# Patient Record
Sex: Male | Born: 1995 | Race: Black or African American | Hispanic: No | Marital: Single | Smoking: Never smoker
Health system: Southern US, Community
[De-identification: ages and names within clinical notes are randomized; demographics above are authoritative.]

## PROBLEM LIST (undated history)

## (undated) DIAGNOSIS — K589 Irritable bowel syndrome without diarrhea: Secondary | ICD-10-CM

---

## 2015-03-03 ENCOUNTER — Emergency Department (HOSPITAL_COMMUNITY): Payer: Medicaid Other

## 2015-03-03 ENCOUNTER — Emergency Department (HOSPITAL_COMMUNITY)
Admission: EM | Admit: 2015-03-03 | Discharge: 2015-03-03 | Disposition: A | Discharge status: Home / Self Care | Payer: Medicaid Other | Attending: Physician Assistant | Admitting: Physician Assistant

## 2015-03-03 ENCOUNTER — Encounter (HOSPITAL_COMMUNITY): Payer: Self-pay | Admitting: Emergency Medicine

## 2015-03-03 DIAGNOSIS — J069 Acute upper respiratory infection, unspecified: Secondary | ICD-10-CM | POA: Insufficient documentation

## 2015-03-03 DIAGNOSIS — R110 Nausea: Secondary | ICD-10-CM | POA: Insufficient documentation

## 2015-03-03 DIAGNOSIS — Z79899 Other long term (current) drug therapy: Secondary | ICD-10-CM | POA: Insufficient documentation

## 2015-03-03 DIAGNOSIS — R197 Diarrhea, unspecified: Secondary | ICD-10-CM | POA: Insufficient documentation

## 2015-03-03 DIAGNOSIS — R05 Cough: Secondary | ICD-10-CM | POA: Diagnosis present

## 2015-03-03 DIAGNOSIS — Z792 Long term (current) use of antibiotics: Secondary | ICD-10-CM | POA: Diagnosis not present

## 2015-03-03 DIAGNOSIS — J40 Bronchitis, not specified as acute or chronic: Secondary | ICD-10-CM | POA: Insufficient documentation

## 2015-03-03 MED ORDER — ONDANSETRON 4 MG PO TBDP: 4.0000 mg | ORAL_TABLET | Freq: Once | ORAL | Status: DC

## 2015-03-03 MED ORDER — PREDNISONE 20 MG PO TABS
60.0000 mg | ORAL_TABLET | Freq: Once | ORAL | Status: AC
  Administered 2015-03-03: 60 mg via ORAL
  Filled 2015-03-03: qty 3

## 2015-03-03 MED ORDER — BENZONATATE 100 MG PO CAPS
100.0000 mg | ORAL_CAPSULE | Freq: Once | ORAL | Status: AC
  Administered 2015-03-03: 100 mg via ORAL
  Filled 2015-03-03: qty 1

## 2015-03-03 MED ORDER — PREDNISONE 20 MG PO TABS: 40.0000 mg | ORAL_TABLET | Freq: Every day | ORAL | Status: AC

## 2015-03-03 MED ORDER — BENZONATATE 100 MG PO CAPS: 100.0000 mg | ORAL_CAPSULE | Freq: Three times a day (TID) | ORAL | Status: AC

## 2015-03-03 MED ORDER — IPRATROPIUM-ALBUTEROL 0.5-2.5 (3) MG/3ML IN SOLN
3.0000 mL | Freq: Once | RESPIRATORY_TRACT | Status: AC
  Administered 2015-03-03: 3 mL via RESPIRATORY_TRACT
  Filled 2015-03-03: qty 3

## 2015-03-03 MED ORDER — ALBUTEROL SULFATE HFA 108 (90 BASE) MCG/ACT IN AERS: 2.0000 | INHALATION_SPRAY | RESPIRATORY_TRACT | Status: AC | PRN

## 2015-03-03 NOTE — Discharge Instructions (Signed)
Acute Bronchitis  Bronchitis is inflammation of the airways that extend from the windpipe into the lungs (bronchi). The inflammation often causes mucus to develop. This leads to a cough, which is the most common symptom of bronchitis.  In acute bronchitis, the condition usually develops suddenly and goes away over time, usually in a couple weeks. Smoking, allergies, and asthma can make bronchitis worse. Repeated episodes of bronchitis may cause further lung problems.  CAUSES Acute bronchitis is most often caused by the same virus that causes a cold. The virus can spread from person to person (contagious) through coughing, sneezing, and touching contaminated objects. SIGNS AND SYMPTOMS   Cough.   Fever.   Coughing up mucus.   Body aches.   Chest congestion.   Chills.   Shortness of breath.   Sore throat.  DIAGNOSIS  Acute bronchitis is usually diagnosed through a physical exam. Your health care provider will also ask you questions about your medical history. Tests, such as chest X-rays, are sometimes done to rule out other conditions.  TREATMENT  Acute bronchitis usually goes away in a couple weeks. Oftentimes, no medical treatment is necessary. Medicines are sometimes given for relief of fever or cough. Antibiotic medicines are usually not needed but may be prescribed in certain situations. In some cases, an inhaler may be recommended to help reduce shortness of breath and control the cough. A cool mist vaporizer may also be used to help thin bronchial secretions and make it easier to clear the chest.  HOME CARE INSTRUCTIONS  Get plenty of rest.   Drink enough fluids to keep your urine clear or pale yellow (unless you have a medical condition that requires fluid restriction). Increasing fluids may help thin your respiratory secretions (sputum) and reduce chest congestion, and it will prevent dehydration.   Take medicines only as directed by your health care provider.  If  you were prescribed an antibiotic medicine, finish it all even if you start to feel better.  Avoid smoking and secondhand smoke. Exposure to cigarette smoke or irritating chemicals will make bronchitis worse. If you are a smoker, consider using nicotine gum or skin patches to help control withdrawal symptoms. Quitting smoking will help your lungs heal faster.   Reduce the chances of another bout of acute bronchitis by washing your hands frequently, avoiding people with cold symptoms, and trying not to touch your hands to your mouth, nose, or eyes.   Keep all follow-up visits as directed by your health care provider.  SEEK MEDICAL CARE IF: Your symptoms do not improve after 1 week of treatment.  SEEK IMMEDIATE MEDICAL CARE IF:  You develop an increased fever or chills.   You have chest pain.   You have severe shortness of breath.  You have bloody sputum.   You develop dehydration.  You faint or repeatedly feel like you are going to pass out.  You develop repeated vomiting.  You develop a severe headache. MAKE SURE YOU:   Understand these instructions.  Will watch your condition.  Will get help right away if you are not doing well or get worse.   This information is not intended to replace advice given to you by your health care provider. Make sure you discuss any questions you have with your health care provider.   Document Released: 02/08/2004 Document Revised: 01/21/2014 Document Reviewed: 06/23/2012 Elsevier Interactive Patient Education 2016 [employer] Inc[employer]   Upper Respiratory Infection, Adult Most upper respiratory infections (URIs) are a viral infection of the air  passages leading to the lungs. A URI affects the nose, throat, and upper air passages. The most common type of URI is nasopharyngitis and is typically referred to as "the common cold." URIs run their course and usually go away on their own. Most of the time, a URI does not require medical attention, but  sometimes a bacterial infection in the upper airways can follow a viral infection. This is called a secondary infection. Sinus and middle ear infections are common types of secondary upper respiratory infections. Bacterial pneumonia can also complicate a URI. A URI can worsen asthma and chronic obstructive pulmonary disease (COPD). Sometimes, these complications can require emergency medical care and may be life threatening.  CAUSES Almost all URIs are caused by viruses. A virus is a type of germ and can spread from one person to another.  RISKS FACTORS You may be at risk for a URI if:   You smoke.   You have chronic heart or lung disease.  You have a weakened defense (immune) system.   You are very young or very old.   You have nasal allergies or asthma.  You work in crowded or poorly ventilated areas.  You work in health care facilities or schools. SIGNS AND SYMPTOMS  Symptoms typically develop 2-3 days after you come in contact with a cold virus. Most viral URIs last 7-10 days. However, viral URIs from the influenza virus (flu virus) can last 14-18 days and are typically more severe. Symptoms may include:   Runny or stuffy (congested) nose.   Sneezing.   Cough.   Sore throat.   Headache.   Fatigue.   Fever.   Loss of appetite.   Pain in your forehead, behind your eyes, and over your cheekbones (sinus pain).  Muscle aches.  DIAGNOSIS  Your health care provider may diagnose a URI by:  Physical exam.  Tests to check that your symptoms are not due to another condition such as:  Strep throat.  Sinusitis.  Pneumonia.  Asthma. TREATMENT  A URI goes away on its own with time. It cannot be cured with medicines, but medicines may be prescribed or recommended to relieve symptoms. Medicines may help:  Reduce your fever.  Reduce your cough.  Relieve nasal congestion. HOME CARE INSTRUCTIONS   Take medicines only as directed by your health care  provider.   Gargle warm saltwater or take cough drops to comfort your throat as directed by your health care provider.  Use a warm mist humidifier or inhale steam from a shower to increase air moisture. This may make it easier to breathe.  Drink enough fluid to keep your urine clear or pale yellow.   Eat soups and other clear broths and maintain good nutrition.   Rest as needed.   Return to work when your temperature has returned to normal or as your health care provider advises. You may need to stay home longer to avoid infecting others. You can also use a face mask and careful hand washing to prevent spread of the virus.  Increase the usage of your inhaler if you have asthma.   Do not use any tobacco products, including cigarettes, chewing tobacco, or electronic cigarettes. If you need help quitting, ask your health care provider. PREVENTION  The best way to protect yourself from getting a cold is to practice good hygiene.   Avoid oral or hand contact with people with cold symptoms.   Wash your hands often if contact occurs.  There is no clear  evidence that vitamin C, vitamin E, echinacea, or exercise reduces the chance of developing a cold. However, it is always recommended to get plenty of rest, exercise, and practice good nutrition.  SEEK MEDICAL CARE IF:   You are getting worse rather than better.   Your symptoms are not controlled by medicine.   You have chills.  You have worsening shortness of breath.  You have brown or red mucus.  You have yellow or brown nasal discharge.  You have pain in your face, especially when you bend forward.  You have a fever.  You have swollen neck glands.  You have pain while swallowing.  You have white areas in the back of your throat. SEEK IMMEDIATE MEDICAL CARE IF:   You have severe or persistent:  Headache.  Ear pain.  Sinus pain.  Chest pain.  You have chronic lung disease and any of the  following:  Wheezing.  Prolonged cough.  Coughing up blood.  A change in your usual mucus.  You have a stiff neck.  You have changes in your:  Vision.  Hearing.  Thinking.  Mood. MAKE SURE YOU:   Understand these instructions.  Will watch your condition.  Will get help right away if you are not doing well or get worse.   This information is not intended to replace advice given to you by your health care provider. Make sure you discuss any questions you have with your health care provider.   Document Released: 06/26/2000 Document Revised: 05/17/2014 Document Reviewed: 04/07/2013 Elsevier Interactive Patient Education 2016 [employer] Inc.  Cough, Adult Coughing is a reflex that clears your throat and your airways. Coughing helps to heal and protect your lungs. It is normal to cough occasionally, but a cough that happens with other symptoms or lasts a long time may be a sign of a condition that needs treatment. A cough may last only 2-3 weeks (acute), or it may last longer than 8 weeks (chronic). CAUSES Coughing is commonly caused by:  Breathing in substances that irritate your lungs.  A viral or bacterial respiratory infection.  Allergies.  Asthma.  Postnasal drip.  Smoking.  Acid backing up from the stomach into the esophagus (gastroesophageal reflux).  Certain medicines.  Chronic lung problems, including COPD (or rarely, lung cancer).  Other medical conditions such as heart failure. HOME CARE INSTRUCTIONS  Pay attention to any changes in your symptoms. Take these actions to help with your discomfort:  Take medicines only as told by your health care provider.  If you were prescribed an antibiotic medicine, take it as told by your health care provider. Do not stop taking the antibiotic even if you start to feel better.  Talk with your health care provider before you take a cough suppressant medicine.  Drink enough fluid to keep your urine clear or pale  yellow.  If the air is dry, use a cold steam vaporizer or humidifier in your bedroom or your home to help loosen secretions.  Avoid anything that causes you to cough at work or at home.  If your cough is worse at night, try sleeping in a semi-upright position.  Avoid cigarette smoke. If you smoke, quit smoking. If you need help quitting, ask your health care provider.  Avoid caffeine.  Avoid alcohol.  Rest as needed. SEEK MEDICAL CARE IF:   You have new symptoms.  You cough up pus.  Your cough does not get better after 2-3 weeks, or your cough gets worse.  You cannot  control your cough with suppressant medicines and you are losing sleep.  You develop pain that is getting worse or pain that is not controlled with pain medicines.  You have a fever.  You have unexplained weight loss.  You have night sweats. SEEK IMMEDIATE MEDICAL CARE IF:  You cough up blood.  You have difficulty breathing.  Your heartbeat is very fast.   This information is not intended to replace advice given to you by your health care provider. Make sure you discuss any questions you have with your health care provider.   Document Released: 06/29/2010 Document Revised: 09/21/2014 Document Reviewed: 03/09/2014 Elsevier Interactive Patient Education 2016 [employer].  How to Use an Inhaler Using your inhaler correctly is very important. Good technique will make sure that the medicine reaches your lungs.  HOW TO USE AN INHALER:  Take the cap off the inhaler.  If this is the first time using your inhaler, you need to prime it. Shake the inhaler for 5 seconds. Release four puffs into the air, away from your face. Ask your doctor for help if you have questions.  Shake the inhaler for 5 seconds.  Turn the inhaler so the bottle is above the mouthpiece.  Put your pointer finger on top of the bottle. Your thumb holds the bottom of the inhaler.  Open your mouth.  Either hold the inhaler away from  your mouth (the width of 2 fingers) or place your lips tightly around the mouthpiece. Ask your doctor which way to use your inhaler.  Breathe out as much air as possible.  Breathe in and push down on the bottle 1 time to release the medicine. You will feel the medicine go in your mouth and throat.  Continue to take a deep breath in very slowly. Try to fill your lungs.  After you have breathed in completely, hold your breath for 10 seconds. This will help the medicine to settle in your lungs. If you cannot hold your breath for 10 seconds, hold it for as long as you can before you breathe out.  Breathe out slowly, through pursed lips. Whistling is an example of pursed lips.  If your doctor has told you to take more than 1 puff, wait at least 15-30 seconds between puffs. This will help you get the best results from your medicine. Do not use the inhaler more than your doctor tells you to.  Put the cap back on the inhaler.  Follow the directions from your doctor or from the inhaler package about cleaning the inhaler. If you use more than one inhaler, ask your doctor which inhalers to use and what order to use them in. Ask your doctor to help you figure out when you will need to refill your inhaler.  If you use a steroid inhaler, always rinse your mouth with water after your last puff, gargle and spit out the water. Do not swallow the water. GET HELP IF:  The inhaler medicine only partially helps to stop wheezing or shortness of breath.  You are having trouble using your inhaler.  You have some increase in thick spit (phlegm). GET HELP RIGHT AWAY IF:  The inhaler medicine does not help your wheezing or shortness of breath or you have tightness in your chest.  You have dizziness, headaches, or fast heart rate.  You have chills, fever, or night sweats.  You have a large increase of thick spit, or your thick spit is bloody. MAKE SURE YOU:   Understand these  instructions.  Will watch  your condition.  Will get help right away if you are not doing well or get worse.   This information is not intended to replace advice given to you by your health care provider. Make sure you discuss any questions you have with your health care provider.   Document Released: 10/10/2007 Document Revised: 10/21/2012 Document Reviewed: 07/30/2012 Elsevier Interactive Patient Education [employer]

## 2015-03-03 NOTE — ED Notes (Signed)
Pt states that he has had generalized body aches, cough, nausea and fatigue. Alert and oriented.

## 2015-03-03 NOTE — ED Notes (Signed)
Patient was alert, oriented and stable upon discharge. RN went over AVS and patient had no further questions.  

## 2015-03-03 NOTE — ED Provider Notes (Signed)
CSN: [id number]     Arrival date & time 03/03/15  2117 History  By signing my name below, I, Linus Galas, attest that this documentation has been prepared under the direction and in the presence of  Danelle Berry, PA-C. Electronically Signed: Linus Galas, ED Scribe. 03/04/2015. 10:59 PM.   Chief Complaint  Patient presents with  . Cough  . Generalized Body Aches   The history is provided by the patient. No language interpreter was used.   HPI Comments: Marvin Mendoza is a 20 y.o. male with no PMHx who presents to the Emergency Department complaining of cough and generalized body aches for one week with associated URI sx including  nasal congestion, productive cough with yellow sputum, fatigue, body ache.  He was seen several days ago by a provider at student [facility] and was given Allegra-D and amoxicillin. He does not know what the antibiotic was prescribed for. He reports no improvement in his symptoms. He did develop nausea and diarrhea over the past 2 days. He reports 1 brief instances of shortness of breath when walking on campus to class.  He denies wheeze.  He also had one episode of brief central chest pain 1 week ago that lasted for several hours but was not associated with shortness of breath, palpitations, diaphoresis, pallor, syncope, nausea or vomiting.   the chest pain had no radiation, was not worse with exertion, deep inspiration, cough or palpation. It spontaneously resolved and he has had no further chest pain issues he currently denies chest pain, wheezing and shortness of breath. Pt further denies vomiting, abdominal pain, fevers, chills, sweats, hemoptysis.  No recent travel.  Multiple sick contacts.  History reviewed. No pertinent past medical history. No past surgical history on file. History reviewed. No pertinent family history.   Social History  Substance Use Topics  . Smoking status: None  . Smokeless tobacco: None  . Alcohol Use: None    Review of Systems   Constitutional: Positive for fatigue. Negative for fever, chills and diaphoresis.  HENT: Positive for congestion.   Respiratory: Positive for cough and shortness of breath. Negative for wheezing.   Cardiovascular: Positive for chest pain.  Gastrointestinal: Positive for nausea and diarrhea. Negative for vomiting and abdominal pain.  Musculoskeletal: Positive for myalgias.  All other systems reviewed and are negative.   Allergies  Review of patient's allergies indicates no known allergies.  Home Medications   Prior to Admission medications   Medication Sig Start Date End Date Taking? Authorizing Provider  amoxicillin (AMOXIL) 500 MG capsule Take 500 mg by mouth 3 (three) times daily.   Yes Historical Provider, MD  diphenhydrAMINE (BENADRYL) 25 MG tablet Take 25 mg by mouth every 6 (six) hours as needed for allergies.   Yes Historical Provider, MD  fexofenadine-pseudoephedrine (ALLEGRA-D) 60-120 MG 12 hr tablet Take 1 tablet by mouth 2 (two) times daily as needed (allergies).   Yes Historical Provider, MD  hydrOXYzine (ATARAX/VISTARIL) 25 MG tablet Take 25 mg by mouth every 6 (six) hours as needed for anxiety (insomnia).  01/27/15  Yes Historical Provider, MD  NEXIUM 40 MG capsule Take 40 mg by mouth daily. Reported on 03/03/2015 02/20/15  Yes Historical Provider, MD  Phenyleph-Doxylamine-DM-APAP (ALKA SELTZER PLUS PO) Take 1 tablet by mouth every 6 (six) hours as needed (cold symptoms).   Yes Historical Provider, MD  promethazine (PHENERGAN) 25 MG tablet Take 25 mg by mouth every 6 (six) hours as needed for nausea or vomiting.  02/20/15  Yes Historical Provider,  MD  Pseudoeph-CPM-DM-APAP (TYLENOL COLD) 30-2-15-325 MG TABS Take 2 tablets by mouth every 6 (six) hours as needed (cold symptoms).   Yes Historical Provider, MD  Pseudoephedrine-APAP-DM (DAYQUIL PO) Take 2 capsules by mouth daily as needed (cold symptoms).   Yes Historical Provider, MD  triamcinolone cream (KENALOG) 0.1 % Apply 1  application topically 2 (two) times daily as needed (discoloration).  01/17/15  Yes Historical Provider, MD  albuterol (PROVENTIL HFA;VENTOLIN HFA) 108 (90 Base) MCG/ACT inhaler Inhale 2 puffs into the lungs every 4 (four) hours as needed for wheezing or shortness of breath. 03/03/15   Danelle Berry, PA-C  benzonatate (TESSALON) 100 MG capsule Take 1 capsule (100 mg total) by mouth every 8 (eight) hours. 03/03/15   Danelle Berry, PA-C  predniSONE (DELTASONE) 20 MG tablet Take 2 tablets (40 mg total) by mouth daily. 03/03/15   Danelle Berry, PA-C   BP 126/74 mmHg  Pulse 87  Temp(Src) 98.5 F (36.9 C) (Oral)  Resp 18  SpO2 99%   Physical Exam  Constitutional: He is oriented to person, place, and time. He appears well-developed and well-nourished. No distress.  Well appearing male, NAD  HENT:  Head: Normocephalic and atraumatic.  Right Ear: External ear normal.  Left Ear: External ear normal.  Nose: Nose normal.  Mouth/Throat: Oropharynx is clear and moist. No oropharyngeal exudate.  No sinus tenderness to palpation Nasal mucosa mildly erythematous with clear discharge  Eyes: Conjunctivae and EOM are normal. Pupils are equal, round, and reactive to light. Right eye exhibits no discharge. Left eye exhibits no discharge. No scleral icterus.  Neck: Normal range of motion. Neck supple. No JVD present. No tracheal deviation present.  Cardiovascular: Normal rate, regular rhythm, normal heart sounds and intact distal pulses.  Exam reveals no gallop and no friction rub.   No murmur heard. Pulmonary/Chest: Effort normal and breath sounds normal. No stridor. No respiratory distress. He has no wheezes. He has no rales. He exhibits no tenderness.  Abdominal: Soft. Bowel sounds are normal. He exhibits no distension. There is no tenderness. There is no rebound.  Musculoskeletal: Normal range of motion. He exhibits no edema.  Lymphadenopathy:    He has no cervical adenopathy.  Neurological: He is alert and  oriented to person, place, and time. He exhibits normal muscle tone. Coordination normal.  Skin: Skin is warm and dry. No rash noted. He is not diaphoretic. No erythema. No pallor.  Psychiatric: He has a normal mood and affect. His behavior is normal. Judgment and thought content normal.  Nursing note and vitals reviewed.   ED Course  Procedures   DIAGNOSTIC STUDIES: Oxygen Saturation is 99% on room air, normal by my interpretation.    COORDINATION OF CARE: 12:09 AM Discussed treatment plan with pt at bedside and pt agreed to plan.  Imaging Review Dg Chest 2 View  03/03/2015  CLINICAL DATA:  Cough and congestion. Central chest pain. Nausea and diarrhea. Symptoms for 1 week. Nonsmoker. EXAM: CHEST  2 VIEW COMPARISON:  None. FINDINGS: The heart size and mediastinal contours are within normal limits. Both lungs are clear. The visualized skeletal structures are unremarkable. IMPRESSION: No active cardiopulmonary disease. Electronically Signed   By: Burman Nieves M.D.   On: 03/03/2015 22:42   I have personally reviewed and evaluated these images and lab results as part of my medical decision-making.   MDM   Pt with URI sx, he is well appearing and non-toxic.  CXR negative.  Presentation consistent with URI and bronchitis, likely  viral.  Nausea and diarrhea may be viral, but patient o be a SE of Abx.   Brief CP not consistent with ACS.  He has no CP for the past 6 days, no indication for ACS w/up.  Pt is PERC negative, r/o PE. Pt was encouraged to continue sudafed/allergy meds, he was discharged with steroid taper, albuterol as needed for SOB, and tessalon pearls for cough.  Return precautions reviewed.   D/C in good condition with stable VS.   Final diagnoses:  URI (upper respiratory infection)  Bronchitis  I personally performed the services described in this documentation, which was scribed in my presence. The recorded information has been reviewed and is accurate.      Danelle Berry, PA-C 03/04/15 0010  Courteney Randall An, MD 03/05/15 1610

## 2015-11-04 ENCOUNTER — Encounter (HOSPITAL_COMMUNITY): Payer: Self-pay | Admitting: Emergency Medicine

## 2015-11-04 ENCOUNTER — Emergency Department (HOSPITAL_COMMUNITY)
Admission: EM | Admit: 2015-11-04 | Discharge: 2015-11-04 | Disposition: A | Discharge status: Home / Self Care | Payer: Medicaid Other | Attending: Emergency Medicine | Admitting: Emergency Medicine

## 2015-11-04 DIAGNOSIS — R51 Headache: Secondary | ICD-10-CM | POA: Diagnosis present

## 2015-11-04 DIAGNOSIS — R519 Headache, unspecified: Secondary | ICD-10-CM

## 2015-11-04 MED ORDER — FLUTICASONE PROPIONATE 50 MCG/ACT NA SUSP: 1.0000 | Freq: Every day | NASAL | 0 refills | Status: AC

## 2015-11-04 MED ORDER — DIPHENHYDRAMINE HCL 25 MG PO CAPS
25.0000 mg | ORAL_CAPSULE | Freq: Once | ORAL | Status: AC
  Administered 2015-11-04: 25 mg via ORAL
  Filled 2015-11-04: qty 1

## 2015-11-04 MED ORDER — METOCLOPRAMIDE HCL 10 MG PO TABS
10.0000 mg | ORAL_TABLET | Freq: Once | ORAL | Status: AC
  Administered 2015-11-04: 10 mg via ORAL
  Filled 2015-11-04: qty 1

## 2015-11-04 MED ORDER — KETOROLAC TROMETHAMINE 30 MG/ML IJ SOLN
30.0000 mg | Freq: Once | INTRAMUSCULAR | Status: AC
  Administered 2015-11-04: 30 mg via INTRAMUSCULAR
  Filled 2015-11-04: qty 1

## 2015-11-04 NOTE — ED Triage Notes (Signed)
Pt states he has been having a headache now for a week. States Excedrin helps relieve the pain. Pt states the pain is right behind forehead. Pt states he also has sinus pressure. Denies any sensitivity to light. States that he has had nausea but not right now.

## 2015-11-04 NOTE — Discharge Instructions (Signed)
Please read and follow all provided instructions.  Your diagnoses today include:  1. Nonintractable headache, unspecified chronicity pattern, unspecified headache type     Tests performed today include: CT of your head which was normal and did not show any serious cause of your headache Vital signs. See below for your results today.   Medications:  In the Emergency Department you received: Reglan - antinausea/headache medication Benadryl - antihistamine to counteract potential side effects of reglan Toradol - NSAID medication similar to ibuprofen  Take any prescribed medications only as directed.  Additional information:  Follow any educational materials contained in this packet.  You are having a headache. No specific cause was found today for your headache. It may have been a migraine or other cause of headache. Stress, anxiety, fatigue, and depression are common triggers for headaches.   Your headache today does not appear to be life-threatening or require hospitalization, but often the exact cause of headaches is not determined in the emergency department. Therefore, follow-up with your doctor is very important to find out what may have caused your headache and whether or not you need any further diagnostic testing or treatment.   Sometimes headaches can appear benign (not harmful), but then more serious symptoms can develop which should prompt an immediate re-evaluation by your doctor or the emergency department.  BE VERY CAREFUL not to take multiple medicines containing Tylenol (also called acetaminophen). Doing so can lead to an overdose which can damage your liver and cause liver failure and possibly death.   Follow-up instructions: Please follow-up with your primary care provider in the next 3 days for further evaluation of your symptoms.   Return instructions:  Please return to the Emergency Department if you experience worsening symptoms. Return if the medications do not  resolve your headache, if it recurs, or if you have multiple episodes of vomiting or cannot keep down fluids. Return if you have a change from the usual headache. RETURN IMMEDIATELY IF you: Develop a sudden, severe headache Develop confusion or become poorly responsive or faint Develop a fever above 100.62F or problem breathing Have a change in speech, vision, swallowing, or understanding Develop new weakness, numbness, tingling, incoordination in your arms or legs Have a seizure Please return if you have any other emergent concerns.  Additional Information:  Your vital signs today were: BP 122/72 (BP Location: Right Arm)    Pulse 81    Temp 98.3 F (36.8 C) (Oral)    Resp 20    Wt 71.7 kg    SpO2 100%  If your blood pressure (BP) was elevated above 135/85 this visit, please have this repeated by your doctor within one month. --------------

## 2015-11-04 NOTE — ED Provider Notes (Signed)
[facility]EMERGENCY DEPT Provider Note   CSN: [id number] Arrival date & time: 11/04/15  1519  History   Chief Complaint Chief Complaint  Patient presents with  . Headache    HPI Marvin Mendoza is a 20 y.o. male.  HPI  20 y.o. male presents to the Emergency Department today complaining of frontal headache x 1 week. Notes no hx of migraine headaches. States headache feels like a throbbing sensation that comes and goes. Rates pain 7/10. Associated nausea. No emesis. No CP/SOB/ABD pain. No visual changes. No numbness/tingling. No photophobia. Notes Excedrin with relief, but headache reoccurs. No fevers. Pt states that he usually gets these headache with season changes. No other symptoms noted.   History reviewed. No pertinent past medical history.  There are no active problems to display for this patient.   History reviewed. No pertinent surgical history.   Home Medications    Prior to Admission medications   Medication Sig Start Date End Date Taking? Authorizing Provider  albuterol (PROVENTIL HFA;VENTOLIN HFA) 108 (90 Base) MCG/ACT inhaler Inhale 2 puffs into the lungs every 4 (four) hours as needed for wheezing or shortness of breath. 03/03/15   Danelle BerryLeisa Tapia, PA-C  amoxicillin (AMOXIL) 500 MG capsule Take 500 mg by mouth 3 (three) times daily.    Historical Provider, MD  benzonatate (TESSALON) 100 MG capsule Take 1 capsule (100 mg total) by mouth every 8 (eight) hours. 03/03/15   Danelle BerryLeisa Tapia, PA-C  diphenhydrAMINE (BENADRYL) 25 MG tablet Take 25 mg by mouth every 6 (six) hours as needed for allergies.    Historical Provider, MD  fexofenadine-pseudoephedrine (ALLEGRA-D) 60-120 MG 12 hr tablet Take 1 tablet by mouth 2 (two) times daily as needed (allergies).    Historical Provider, MD  hydrOXYzine (ATARAX/VISTARIL) 25 MG tablet Take 25 mg by mouth every 6 (six) hours as needed for anxiety (insomnia).  01/27/15   Historical Provider, MD  NEXIUM 40 MG capsule Take 40 mg by mouth daily. Reported  on 03/03/2015 02/20/15   Historical Provider, MD  Phenyleph-Doxylamine-DM-APAP (ALKA SELTZER PLUS PO) Take 1 tablet by mouth every 6 (six) hours as needed (cold symptoms).    Historical Provider, MD  predniSONE (DELTASONE) 20 MG tablet Take 2 tablets (40 mg total) by mouth daily. 03/03/15   Danelle BerryLeisa Tapia, PA-C  promethazine (PHENERGAN) 25 MG tablet Take 25 mg by mouth every 6 (six) hours as needed for nausea or vomiting.  02/20/15   Historical Provider, MD  Pseudoeph-CPM-DM-APAP (TYLENOL COLD) 30-2-15-325 MG TABS Take 2 tablets by mouth every 6 (six) hours as needed (cold symptoms).    Historical Provider, MD  Pseudoephedrine-APAP-DM (DAYQUIL PO) Take 2 capsules by mouth daily as needed (cold symptoms).    Historical Provider, MD  triamcinolone cream (KENALOG) 0.1 % Apply 1 application topically 2 (two) times daily as needed (discoloration).  01/17/15   Historical Provider, MD   Family History No family history on file.  Social History Social History  Substance Use Topics  . Smoking status: Never Smoker  . Smokeless tobacco: Never Used  . Alcohol use Yes     Comment: socially   Allergies   Review of patient's allergies indicates no known allergies.   Review of Systems Review of Systems ROS reviewed and all are negative for acute change except as noted in the HPI.  Physical Exam Updated Vital Signs BP 122/72 (BP Location: Right Arm)   Pulse 81   Temp 98.3 F (36.8 C) (Oral)   Resp 20   Wt 71.7 kg  SpO2 100%   Physical Exam  Constitutional: He is oriented to person, place, and time. Vital signs are normal. He appears well-developed and well-nourished.  HENT:  Head: Normocephalic and atraumatic.  Right Ear: Hearing, tympanic membrane, external ear and ear canal normal.  Left Ear: Hearing, tympanic membrane, external ear and ear canal normal.  Nose: Nose normal.  Mouth/Throat: Uvula is midline, oropharynx is clear and moist and mucous membranes are normal.  Eyes: Conjunctivae and EOM  are normal. Pupils are equal, round, and reactive to light.  Neck: Normal range of motion. Neck supple.  Cardiovascular: Normal rate, regular rhythm, normal heart sounds and intact distal pulses.   Pulmonary/Chest: Effort normal and breath sounds normal.  Neurological: He is alert and oriented to person, place, and time. He has normal strength. No cranial nerve deficit or sensory deficit.  Cranial Nerves:  II: Pupils equal, round, reactive to light III,IV, VI: ptosis not present, extra-ocular motions intact bilaterally  V,VII: smile symmetric, facial light touch sensation equal VIII: hearing grossly normal bilaterally  IX,X: midline uvula rise  XI: bilateral shoulder shrug equal and strong XII: midline tongue extension  Skin: Skin is warm and dry.  Psychiatric: He has a normal mood and affect. His speech is normal and behavior is normal. Thought content normal.  Nursing note and vitals reviewed.  ED Treatments / Results  Labs (all labs ordered are listed, but only abnormal results are displayed) Labs Reviewed - No data to display  EKG  EKG Interpretation None      Radiology No results found.  Procedures Procedures (including critical care time)  Medications Ordered in ED Medications - No data to display   Initial Impression / Assessment and Plan / ED Course  I have reviewed the triage vital signs and the nursing notes.  Pertinent labs & imaging results that were available during my care of the patient were reviewed by me and considered in my medical decision making (see chart for details).  Clinical Course   Final Clinical Impressions(s) / ED Diagnoses  I have reviewed the relevant previous healthcare records. I obtained HPI from historian.  ED Course:  Assessment: Patient is a 20yM that presents with headache x 1 week. Patient is without high-risk features of headache including: Sudden onset/thunderclap HA, No similar headache in past, Altered mental status,  Accompanying seizure, Headache with exertion, Age > 50, History of immunocompromise, Neck or shoulder pain, Fever, Use of anticoagulation, Family history of spontaneous SAH, Concomitant drug use, Toxic exposure.  Patient has a normal complete neurological exam, normal vital signs, normal level of consciousness, no signs of meningismus, is well-appearing/non-toxic appearing, no signs of trauma. No papilledema, no pain over the temporal arteries. Imaging with CT/MRI not indicated given history and physical exam findings. No dangerous or life-threatening conditions suspected or identified by history, physical exam, and by work-up. No indications for hospitalization identified. Likely sinus pressure related. Given Toradol in ED and Rx Flonase. At time of discharge, Patient is in no acute distress. Vital Signs are stable. Patient is able to ambulate. Patient able to tolerate PO.   Disposition/Plan:  Dc Home Additional Verbal discharge instructions given and discussed with patient.  Pt Instructed to f/u with PCP in the next week for evaluation and treatment of symptoms. Return precautions given Pt acknowledges and agrees with plan  Supervising Physician Loren Racer, MD   Final diagnoses:  Nonintractable headache, unspecified chronicity pattern, unspecified headache type    New Prescriptions New Prescriptions   No medications  on file     Audry Pili, PA-C 11/04/15 1644    Loren Racer, MD 11/10/15 [phone]

## 2015-11-04 NOTE — ED Notes (Signed)
Patient verbalized understanding of discharge instructions and denies any further needs or questions at this time. VS stable. Patient ambulatory with steady gait.  

## 2015-11-12 ENCOUNTER — Emergency Department (HOSPITAL_COMMUNITY): Payer: Medicaid Other

## 2015-11-12 ENCOUNTER — Encounter (HOSPITAL_COMMUNITY): Payer: Self-pay | Admitting: *Deleted

## 2015-11-12 ENCOUNTER — Emergency Department (HOSPITAL_COMMUNITY)
Admission: EM | Admit: 2015-11-12 | Discharge: 2015-11-12 | Disposition: A | Discharge status: Home / Self Care | Payer: Medicaid Other | Attending: Emergency Medicine | Admitting: Emergency Medicine

## 2015-11-12 DIAGNOSIS — R519 Headache, unspecified: Secondary | ICD-10-CM

## 2015-11-12 DIAGNOSIS — R51 Headache: Secondary | ICD-10-CM | POA: Insufficient documentation

## 2015-11-12 HISTORY — DX: Irritable bowel syndrome, unspecified: K58.9

## 2015-11-12 LAB — I-STAT CHEM 8, ED
BUN: 12 mg/dL (ref 6–20)
CALCIUM ION: 1.2 mmol/L (ref 1.15–1.40)
Chloride: 102 mmol/L (ref 101–111)
Creatinine, Ser: 0.9 mg/dL (ref 0.61–1.24)
GLUCOSE: 102 mg/dL — AB (ref 65–99)
HCT: 42 % (ref 39.0–52.0)
HEMOGLOBIN: 14.3 g/dL (ref 13.0–17.0)
Potassium: 3.8 mmol/L (ref 3.5–5.1)
SODIUM: 142 mmol/L (ref 135–145)
TCO2: 26 mmol/L (ref 0–100)

## 2015-11-12 NOTE — Discharge Instructions (Signed)
Tylenol 1000 mg rotated with ibuprofen 600 mg every 4 hours as needed for headache.  Follow-up with your primary Dr. if not improving in the next 3-4 days.

## 2015-11-12 NOTE — ED Provider Notes (Signed)
[facility] Provider Note   CSN: [id number] Arrival date & time: 11/12/15  1209     History   Chief Complaint Chief Complaint  Patient presents with  . Headache  . Night Sweats    HPI Marvin Mendoza is a 20 y.o. male.  Patient is a 20 year old male with history of irritable bowel. He presents today for evaluation of headache for the past 2-1/2 weeks. He reports intermittent fever and feels sweaty at night. He denies any visual disturbances, nausea. He denies any dizziness or lightheadedness. He does have some relief with Excedrin, however the headache returns. He is currently taking Zithromax for a presumed sinus infection. This was prescribed one week ago.      Past Medical History:  Diagnosis Date  . IBS (irritable bowel syndrome)     There are no active problems to display for this patient.   History reviewed. No pertinent surgical history.     Home Medications    Prior to Admission medications   Medication Sig Start Date End Date Taking? Authorizing Provider  albuterol (PROVENTIL HFA;VENTOLIN HFA) 108 (90 Base) MCG/ACT inhaler Inhale 2 puffs into the lungs every 4 (four) hours as needed for wheezing or shortness of breath. 03/03/15   Danelle BerryLeisa Tapia, PA-C  amoxicillin (AMOXIL) 500 MG capsule Take 500 mg by mouth 3 (three) times daily.    Historical Provider, MD  benzonatate (TESSALON) 100 MG capsule Take 1 capsule (100 mg total) by mouth every 8 (eight) hours. 03/03/15   Danelle BerryLeisa Tapia, PA-C  diphenhydrAMINE (BENADRYL) 25 MG tablet Take 25 mg by mouth every 6 (six) hours as needed for allergies.    Historical Provider, MD  fexofenadine-pseudoephedrine (ALLEGRA-D) 60-120 MG 12 hr tablet Take 1 tablet by mouth 2 (two) times daily as needed (allergies).    Historical Provider, MD  fluticasone (FLONASE) 50 MCG/ACT nasal spray Place 1 spray into both nostrils daily. 11/04/15   Audry Piliyler Mohr, PA-C  hydrOXYzine (ATARAX/VISTARIL) 25 MG tablet Take 25 mg by mouth every 6 (six)  hours as needed for anxiety (insomnia).  01/27/15   Historical Provider, MD  NEXIUM 40 MG capsule Take 40 mg by mouth daily. Reported on 03/03/2015 02/20/15   Historical Provider, MD  Phenyleph-Doxylamine-DM-APAP (ALKA SELTZER PLUS PO) Take 1 tablet by mouth every 6 (six) hours as needed (cold symptoms).    Historical Provider, MD  predniSONE (DELTASONE) 20 MG tablet Take 2 tablets (40 mg total) by mouth daily. 03/03/15   Danelle BerryLeisa Tapia, PA-C  promethazine (PHENERGAN) 25 MG tablet Take 25 mg by mouth every 6 (six) hours as needed for nausea or vomiting.  02/20/15   Historical Provider, MD  Pseudoeph-CPM-DM-APAP (TYLENOL COLD) 30-2-15-325 MG TABS Take 2 tablets by mouth every 6 (six) hours as needed (cold symptoms).    Historical Provider, MD  Pseudoephedrine-APAP-DM (DAYQUIL PO) Take 2 capsules by mouth daily as needed (cold symptoms).    Historical Provider, MD  triamcinolone cream (KENALOG) 0.1 % Apply 1 application topically 2 (two) times daily as needed (discoloration).  01/17/15   Historical Provider, MD    Family History No family history on file.  Social History Social History  Substance Use Topics  . Smoking status: Never Smoker  . Smokeless tobacco: Never Used  . Alcohol use Yes     Comment: socially     Allergies   Review of patient's allergies indicates no known allergies.   Review of Systems Review of Systems  All other systems reviewed and are negative.  Physical Exam Updated Vital Signs BP 146/76 (BP Location: Left Arm)   Pulse 74   Temp 98.2 F (36.8 C) (Oral)   Resp 14   SpO2 100%   Physical Exam  Constitutional: He is oriented to person, place, and time. He appears well-developed and well-nourished. No distress.  HENT:  Head: Normocephalic and atraumatic.  Mouth/Throat: Oropharynx is clear and moist.  TMs are clear bilaterally. There is no maxillofacial tenderness to palpation.  Eyes: EOM are normal. Pupils are equal, round, and reactive to light.  Neck: Normal  range of motion. Neck supple.  Cardiovascular: Normal rate and regular rhythm.  Exam reveals no friction rub.   No murmur heard. Pulmonary/Chest: Effort normal and breath sounds normal. No respiratory distress. He has no wheezes. He has no rales.  Abdominal: Soft. Bowel sounds are normal. He exhibits no distension. There is no tenderness.  Musculoskeletal: Normal range of motion. He exhibits no edema.  Neurological: He is alert and oriented to person, place, and time. Coordination normal.  Skin: Skin is warm and dry. He is not diaphoretic.  Nursing note and vitals reviewed.    ED Treatments / Results  Labs (all labs ordered are listed, but only abnormal results are displayed) Labs Reviewed  I-STAT CHEM 8, ED - Abnormal; Notable for the following:       Result Value   Glucose, Bld 102 (*)    All other components within normal limits    EKG  EKG Interpretation None       Radiology No results found.  Procedures Procedures (including critical care time)  Medications Ordered in ED Medications - No data to display   Initial Impression / Assessment and Plan / ED Course  I have reviewed the triage vital signs and the nursing notes.  Pertinent labs & imaging results that were available during my care of the patient were reviewed by me and considered in my medical decision making (see chart for details).  Clinical Course    CT scan of the head is unremarkable and neurologic exam is nonfocal. I am uncertain as etiology of the patient's headache, however nothing appears emergent. At this point, he will be discharged with rotating Tylenol and ibuprofen. He is to return as needed if he has any problems.  Final Clinical Impressions(s) / ED Diagnoses   Final diagnoses:  None    New Prescriptions New Prescriptions   No medications on file     Geoffery Lyonsouglas Tyhir Schwan, MD 11/12/15 1503

## 2015-11-12 NOTE — ED Triage Notes (Signed)
Pt states he has had a frontal headached consistent for 2 weeks and reports night sweats for 5 days.  Pt states that he is being treated for a sinus infection with prednisone and zpack.  Pt denies fever.  Reports palpitations

## 2015-11-12 NOTE — ED Triage Notes (Signed)
Pt reports a HA for 2 weeks, night sweats, no fever.

## 2018-04-21 IMAGING — CT CT HEAD W/O CM
3 of 4 series · 17 of 47 positions shown, 20 images · non-contrast
Comparison: None.

CLINICAL DATA: Headache for the past 2 weeks.

EXAM:
CT HEAD WITHOUT CONTRAST
TECHNIQUE: Contiguous axial images were obtained from the base of the skull
through the vertex without intravenous contrast.

[Series 201: head w/o, idose (1) · axial · non-contrast · 0.49mm/px · z∈[+302,+422]mm · 11 of 30 slices shown, 14 images]
[im 3/30  brain]
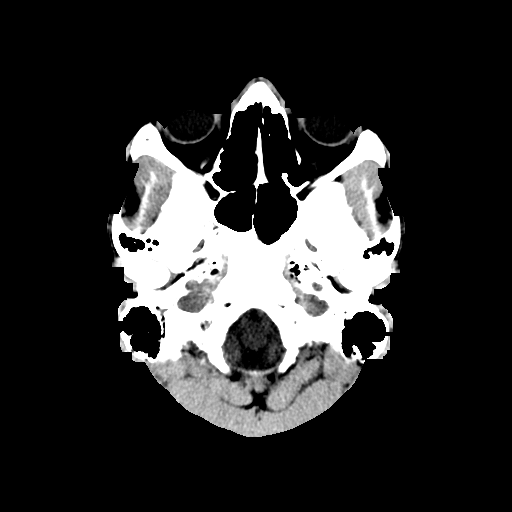
[im 3/30  bone]
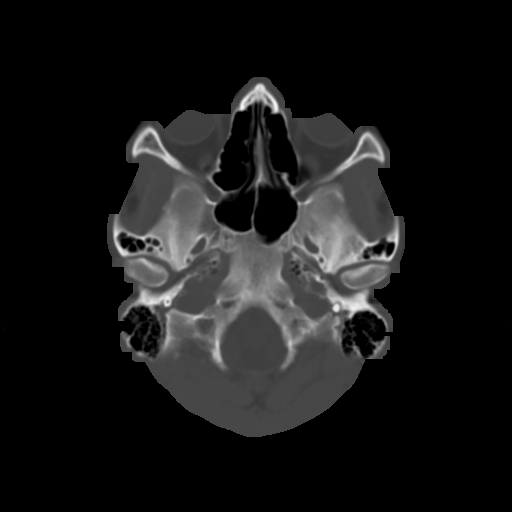
[im 5/30  brain]
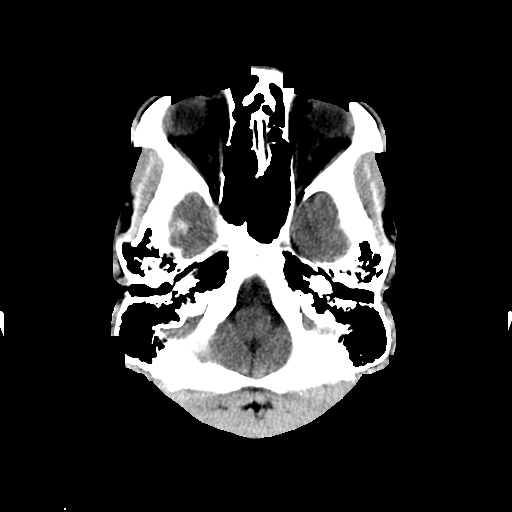
[im 7/30  brain]
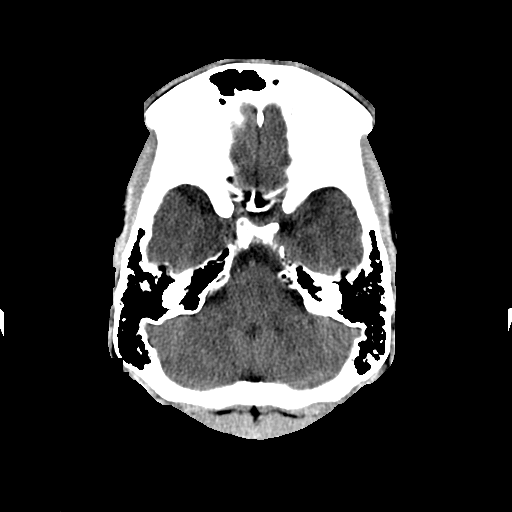
[im 11/30  brain]
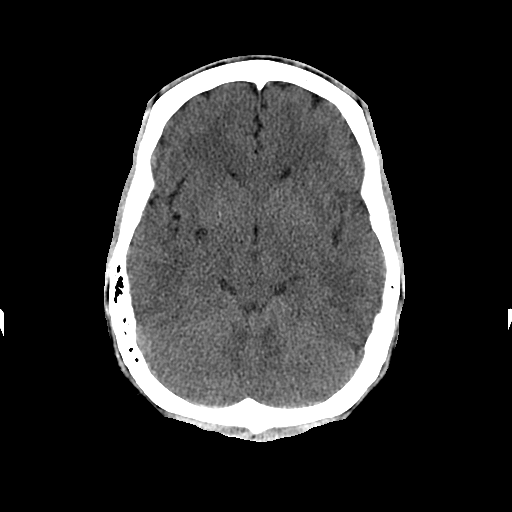
[im 13/30  brain]
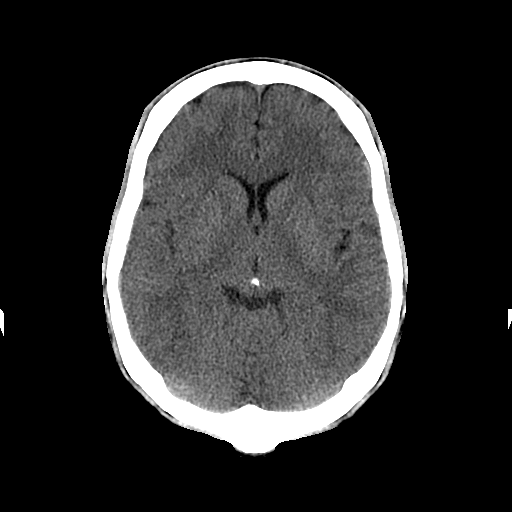
[im 13/30  bone]
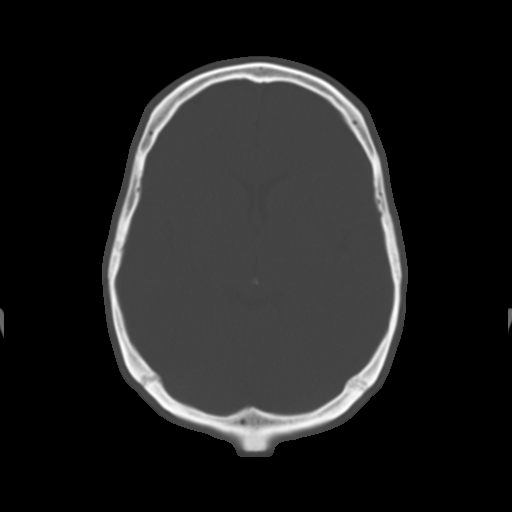
[im 15/30  brain]
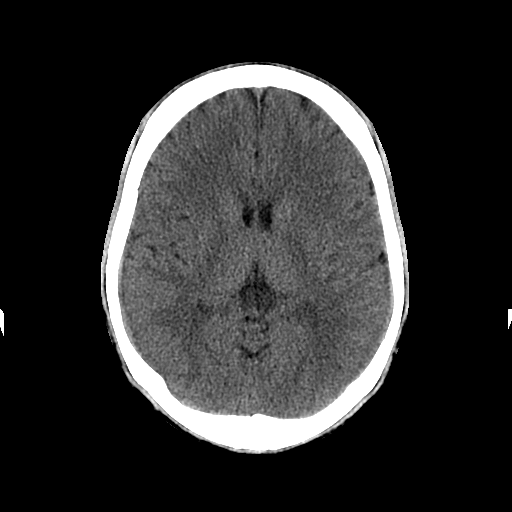
[im 17/30  brain]
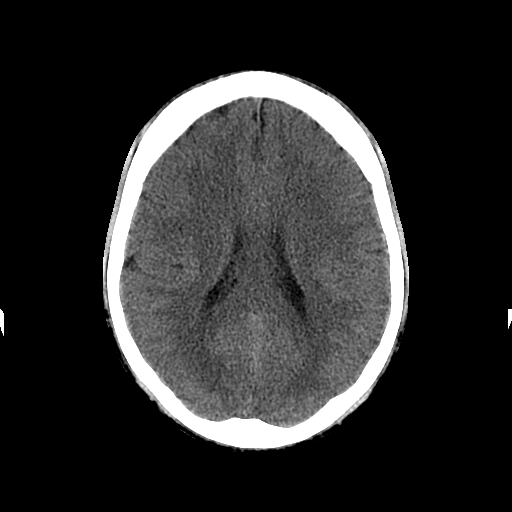
[im 19/30  brain]
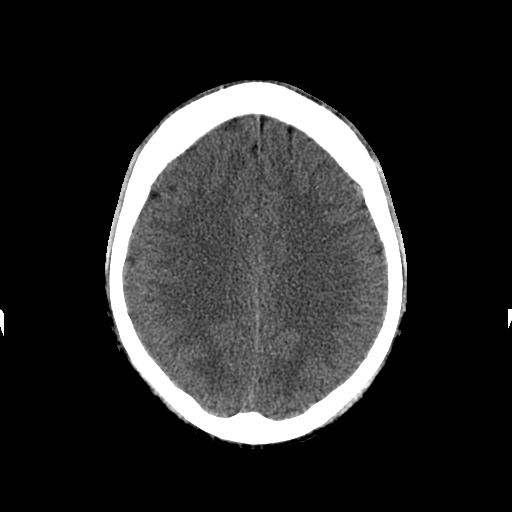
[im 23/30  brain]
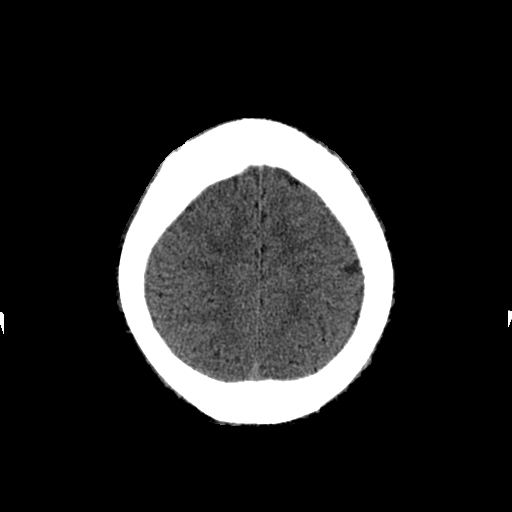
[im 23/30  bone]
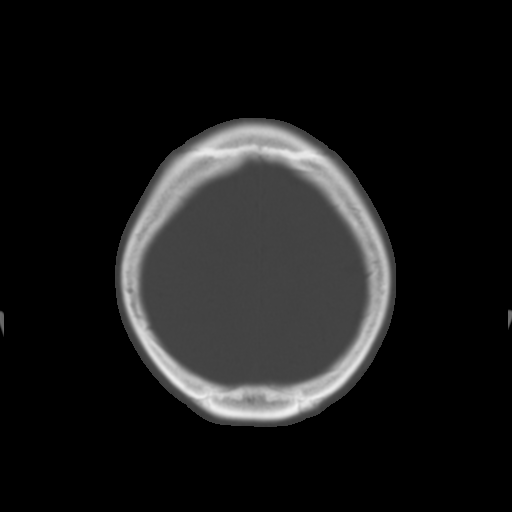
[im 25/30  brain]
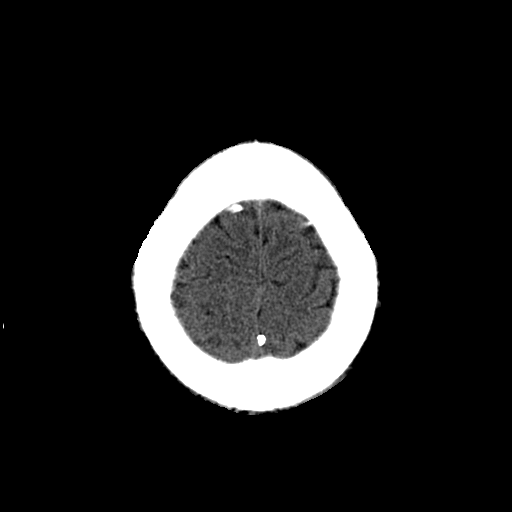
[im 27/30  brain]
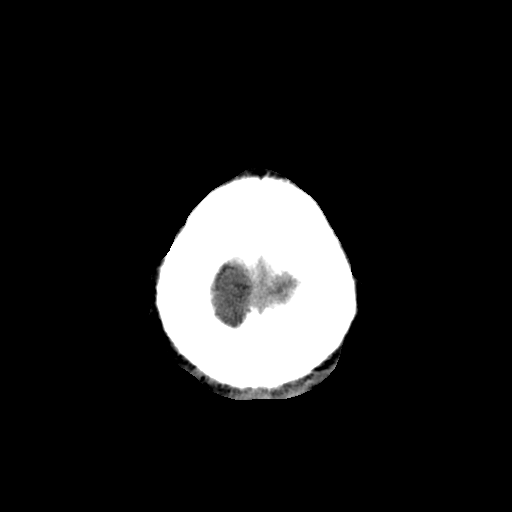

[Series 203: coronal st, idose (1) · coronal · 0.40mm/px · 3 of 75 slices shown]
[im 25/75  brain]
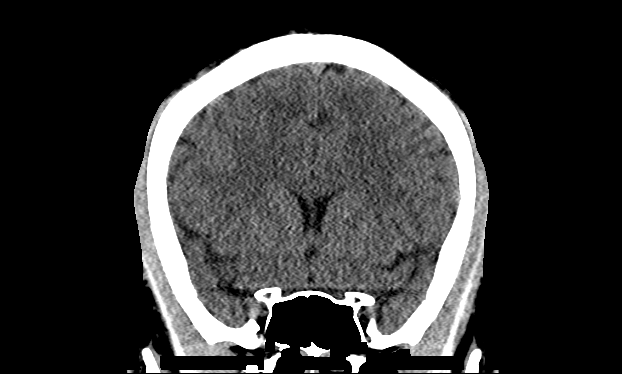
[im 33/75  brain]
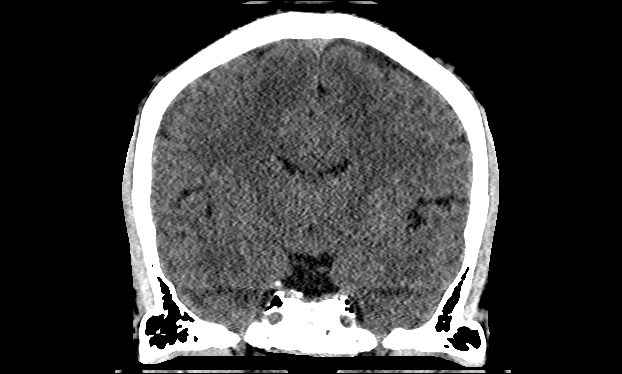
[im 42/75  brain]
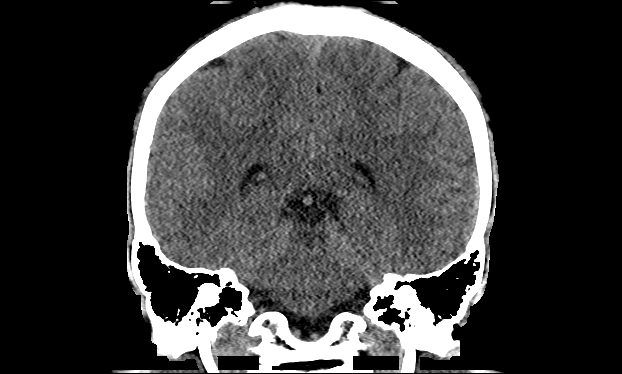

[Series 204: sagittal st, idose (1) · sagittal · 0.40mm/px · 3 of 83 slices shown]
[im 28/83  brain]
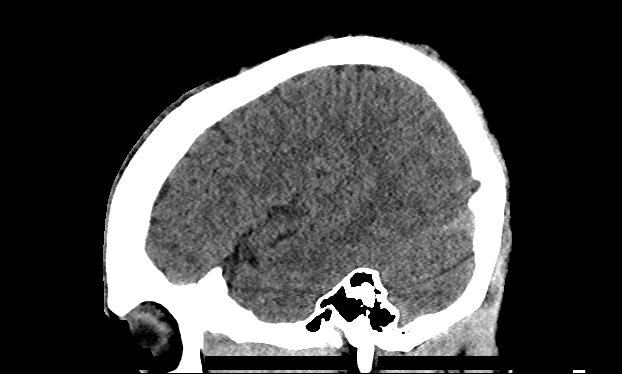
[im 42/83  brain]
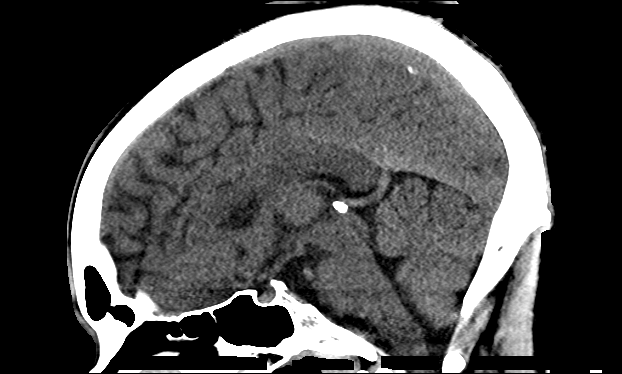
[im 55/83  brain]
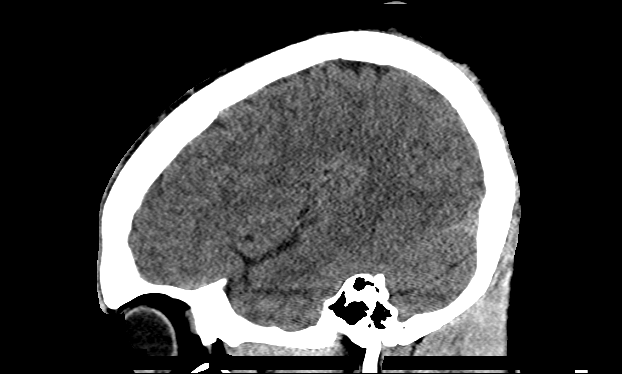

[17 of 47 positions shown; findings below may reference images not displayed]

FINDINGS: Brain: No evidence of acute infarction, hemorrhage, hydrocephalus,
extra-axial collection or mass lesion/mass effect.

Vascular: No hyperdense vessel or unexpected calcification.

Skull: Normal. Negative for fracture or focal lesion.

Sinuses/Orbits: No acute finding.

Other: None.
IMPRESSION: Normal examination.
